# Patient Record
Sex: Male | Born: 1990 | Hispanic: No | Marital: Single
Health system: Midwestern US, Community
[De-identification: ages and names within clinical notes are randomized; demographics above are authoritative.]

## PROBLEM LIST (undated history)

## (undated) DIAGNOSIS — A150 Tuberculosis of lung: Secondary | ICD-10-CM

---
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
## 2012-02-13 ENCOUNTER — Encounter (HOSPITAL_COMMUNITY): Payer: Self-pay | Admitting: *Deleted

## 2012-02-13 ENCOUNTER — Emergency Department (HOSPITAL_COMMUNITY)
Admission: EM | Admit: 2012-02-13 | Discharge: 2012-02-13 | Disposition: A | Discharge status: Home / Self Care | Payer: Self-pay | Attending: Emergency Medicine | Admitting: Emergency Medicine

## 2012-02-13 ENCOUNTER — Emergency Department (HOSPITAL_COMMUNITY): Payer: Self-pay

## 2012-02-13 DIAGNOSIS — R059 Cough, unspecified: Secondary | ICD-10-CM | POA: Insufficient documentation

## 2012-02-13 DIAGNOSIS — R0789 Other chest pain: Secondary | ICD-10-CM

## 2012-02-13 DIAGNOSIS — R05 Cough: Secondary | ICD-10-CM | POA: Insufficient documentation

## 2012-02-13 DIAGNOSIS — R109 Unspecified abdominal pain: Secondary | ICD-10-CM | POA: Insufficient documentation

## 2012-02-13 DIAGNOSIS — T148XXA Other injury of unspecified body region, initial encounter: Secondary | ICD-10-CM | POA: Insufficient documentation

## 2012-02-13 DIAGNOSIS — Z201 Contact with and (suspected) exposure to tuberculosis: Secondary | ICD-10-CM | POA: Insufficient documentation

## 2012-02-13 DIAGNOSIS — F17200 Nicotine dependence, unspecified, uncomplicated: Secondary | ICD-10-CM | POA: Insufficient documentation

## 2012-02-13 DIAGNOSIS — Y9289 Other specified places as the place of occurrence of the external cause: Secondary | ICD-10-CM | POA: Insufficient documentation

## 2012-02-13 DIAGNOSIS — R071 Chest pain on breathing: Secondary | ICD-10-CM | POA: Insufficient documentation

## 2012-02-13 DIAGNOSIS — Y9389 Activity, other specified: Secondary | ICD-10-CM | POA: Insufficient documentation

## 2012-02-13 DIAGNOSIS — X503XXA Overexertion from repetitive movements, initial encounter: Secondary | ICD-10-CM | POA: Insufficient documentation

## 2012-02-13 HISTORY — DX: Tuberculosis of lung: A15.0

## 2012-02-13 MED ORDER — NAPROXEN 500 MG PO TABS: 500.0000 mg | ORAL_TABLET | Freq: Two times a day (BID) | ORAL | Status: AC

## 2012-02-13 NOTE — ED Notes (Signed)
[employer] notified for [place] interpreter. Denies having positive TB test on arm, states "health department dx TB from from X-Ray." he is here today for left flank pain, takes rifampin for TB treatment.  Tells interpreter-"pain on rib cage area for 15 days, pain is worse when coughing and sneezing, coughing up black stuff-- works in [employer].

## 2012-02-13 NOTE — ED Notes (Signed)
Has grandfather who died frpm tb, father is also being treated for tb., also being treated for TB

## 2012-02-13 NOTE — ED Provider Notes (Signed)
Medical screening examination/treatment/procedure(s) were performed by non-physician practitioner and as supervising physician I was immediately available for consultation/collaboration.  Jones Skene, M.D.     Jones Skene, MD 02/13/12 2345

## 2012-02-13 NOTE — ED Notes (Signed)
denes fevers,

## 2012-02-13 NOTE — ED Provider Notes (Signed)
History     CSN: [id number]  Arrival date & time 02/13/12  1444   First MD Initiated Contact with Patient 02/13/12 1556      Chief Complaint  Patient presents with  . Flank Pain    (Consider location/radiation/quality/duration/timing/severity/associated sxs/prior treatment) The history is provided by the patient and medical records. The history is limited by a language barrier. A language interpreter was used.    Derrick Mathis is a 21 y.o. male  with a hx of TB exposure on prophylactic Rifampin presents to the Emergency Department complaining of gradual, persistent, stabilized L rib pain onset 15 days ago. Associated symptoms include cough, pain with movement, walking, lifting and most intensely with coughing.  Nothing occluding ice makes it better and walking moving palpation makes it worse.  Pt denies fever, chills, headache, chest pain, shortness of breath, abdominal pain, nausea, vomiting, diarrhea, weakness, dizziness, syncope, night sweats, weight loss.  Patient taking rifampin secondary to exposure to his grandfather who died from active TB.  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  Patient also denies dysuria, hematuria, urgency, frequency, penile discharge or pain.  Patient denies trauma, falling or other known injury   Past Medical History  Diagnosis Date  . TB (pulmonary tuberculosis)     History reviewed. No pertinent past surgical history.  History reviewed. No pertinent family history.  History  Substance Use Topics  . Smoking status: Current Every Day Smoker    Types: Cigarettes  . Smokeless tobacco: Not on file  . Alcohol Use: No      Review of Systems  Constitutional: Negative for fever, diaphoresis, appetite change, fatigue and unexpected weight change.  HENT: Negative for mouth sores and neck stiffness.   Eyes: Negative for visual disturbance.  Respiratory: Positive for cough. Negative for chest  tightness, shortness of breath and wheezing.   Cardiovascular: Positive for chest pain.  Gastrointestinal: Negative for nausea, vomiting, abdominal pain, diarrhea and constipation.  Genitourinary: Negative for dysuria, urgency, frequency and hematuria.  Skin: Negative for rash.  Neurological: Negative for syncope, light-headedness and headaches.  Psychiatric/Behavioral: Negative for sleep disturbance. The patient is not nervous/anxious.   All other systems reviewed and are negative.    Allergies  Review of patient's allergies indicates no known allergies.  Home Medications   Current Outpatient Rx  Name  Route  Sig  Dispense  Refill  . NAPROXEN 500 MG PO TABS   Oral   Take 1 tablet (500 mg total) by mouth 2 (two) times daily with a meal.   30 tablet   0     BP 109/69  Pulse 97  Temp 98.6 F (37 C) (Oral)  Resp 20  SpO2 99%  Physical Exam  Nursing note and vitals reviewed. Constitutional: He is oriented to person, place, and time. He appears well-developed and well-nourished. No distress.  HENT:  Head: Normocephalic and atraumatic.  Right Ear: Tympanic membrane, external ear and ear canal normal.  Left Ear: Tympanic membrane, external ear and ear canal normal.  Nose: Nose normal. Right sinus exhibits no maxillary sinus tenderness and no frontal sinus tenderness. Left sinus exhibits no maxillary sinus tenderness and no frontal sinus tenderness.  Mouth/Throat: Uvula is midline, oropharynx is clear and moist and mucous membranes are normal. No oropharyngeal exudate.  Eyes: Conjunctivae normal and EOM are normal. Pupils are equal, round, and reactive to light. No scleral icterus.  Neck: Normal range of motion.  Neck supple.  Cardiovascular: Normal rate, regular rhythm, normal heart sounds and intact distal pulses.  Exam reveals no gallop and no friction rub.   No murmur heard. Pulmonary/Chest: Effort normal and breath sounds normal. No respiratory distress. He has no decreased  breath sounds. He has no wheezes. He has no rhonchi. He has no rales. He exhibits tenderness (tenderness over her floating ribs on the left side, no ecchymosis, erythema ).         Tender to palpation left lower ribs  Abdominal: Soft. Bowel sounds are normal. He exhibits no distension and no mass. There is no splenomegaly or hepatomegaly. There is no tenderness. There is no rigidity, no rebound, no guarding, no CVA tenderness, no tenderness at McBurney's point and negative Murphy's sign. No hernia.  Musculoskeletal: Normal range of motion. He exhibits no edema.  Lymphadenopathy:    He has no cervical adenopathy.  Neurological: He is alert and oriented to person, place, and time. No cranial nerve deficit. He exhibits normal muscle tone. Coordination normal.       Speech is clear and goal oriented Moves extremities without ataxia  Skin: Skin is warm and dry. No rash noted. He is not diaphoretic. No erythema.  Psychiatric: He has a normal mood and affect.    ED Course  Procedures (including critical care time)  Labs Reviewed - No data to display Dg Chest 2 View  02/13/2012  *RADIOLOGY REPORT*  Clinical Data: Pain and cough.  Possible tuberculosis history.  CHEST - 2 VIEW  Comparison: Chest radiograph 10/11/2011  Findings: Normal heart, mediastinal, and hilar contours.  The trachea is midline.  The lungs are well expanded and clear.  No scarring, airspace disease, or pleural effusion.  No acute bony abnormality.  IMPRESSION: No acute cardiopulmonary disease.  No radiographic evidence of pulmonary tuberculosis.   Original Report Authenticated By: Britta Mccreedy, M.D.      1. Chest wall pain   2. Muscle strain   3. Exposure to TB       MDM  [name] presents emergency department with chest wall pain. Patient with history of tuberculosis exposure on rifampin and. Chest x-ray without evidence of cardiac pulmonary disease or pulmonary tuberculosis. Patient states he's never had an x-ray that  was positive for tuberculosis that rifampin is simply prophylactic.  Likely muscle strain secondary to his coughing and heavy lifting at work.   Chest pain is not likely of cardiac or pulmonary etiology d/t presentation, perc negative, VSS, no tracheal deviation, no JVD or new murmur, RRR, breath sounds equal bilaterally and negative CXR. Pt has been advised to return to the ED if CP becomes exertional, associated with diaphoresis or nausea, radiates to left jaw/arm, worsens or becomes concerning in any way. Pt appears reliable for follow up and is agreeable to discharge.    1. Medications: Naprosyn, usual home medications 2. Treatment: rest, drink plenty of fluids, take medications as prescribed, use heat and gentle stretching for muscle soreness 3. Follow Up: Please followup with your primary doctor for discussion of your diagnoses and further evaluation after today's visit; if you do not have a primary care doctor use the resource guide provided to find one       Dierdre Forth, PA-C 02/13/12 1729

## 2012-02-13 NOTE — ED Notes (Signed)
Pt reports left side pain below his ribs x 15 days. Denies urinary symptoms. Pain increases with cough and breathing. Pt has been treated for TB x 3 month. Airway intact.

## 2012-10-02 ENCOUNTER — Emergency Department (INDEPENDENT_AMBULATORY_CARE_PROVIDER_SITE_OTHER): Payer: BC Managed Care – PPO

## 2012-10-02 ENCOUNTER — Emergency Department (HOSPITAL_COMMUNITY)
Admission: EM | Admit: 2012-10-02 | Discharge: 2012-10-02 | Disposition: A | Discharge status: Home / Self Care | Payer: BC Managed Care – PPO | Source: Home / Self Care | Attending: Family Medicine | Admitting: Family Medicine

## 2012-10-02 ENCOUNTER — Encounter (HOSPITAL_COMMUNITY): Payer: Self-pay | Admitting: *Deleted

## 2012-10-02 DIAGNOSIS — S60229A Contusion of unspecified hand, initial encounter: Secondary | ICD-10-CM

## 2012-10-02 DIAGNOSIS — S63601A Unspecified sprain of right thumb, initial encounter: Secondary | ICD-10-CM

## 2012-10-02 DIAGNOSIS — S60221A Contusion of right hand, initial encounter: Secondary | ICD-10-CM

## 2012-10-02 DIAGNOSIS — S6390XA Sprain of unspecified part of unspecified wrist and hand, initial encounter: Secondary | ICD-10-CM

## 2012-10-02 NOTE — ED Provider Notes (Signed)
History    CSN: [id number] Arrival date & time 10/02/12  1557  None    Chief Complaint  Patient presents with  . Hand Injury   (Consider location/radiation/quality/duration/timing/severity/associated sxs/prior Treatment) HPI Comments: 22 year old man was playing soccer yesterday the soccer ball struck him in the right hand. He describes ball impacting the end of his right thumb and the thenar eminence. He is complaining of pain primarily to the thenar eminence where there is edema and tenderness. No deformity. Denies injury elsewhere.  Past Medical History  Diagnosis Date  . TB (pulmonary tuberculosis)    History reviewed. No pertinent past surgical history. History reviewed. No pertinent family history. History  Substance Use Topics  . Smoking status: Current Every Day Smoker    Types: Cigarettes  . Smokeless tobacco: Not on file  . Alcohol Use: No    Review of Systems  Constitutional: Negative.   Respiratory: Negative.   Cardiovascular: Negative.   Genitourinary: Negative.   Musculoskeletal: Negative for back pain.       Right hand pain as in history of present illness  Skin: Negative.     Allergies  Review of patient's allergies indicates no known allergies.  Home Medications   Current Outpatient Rx  Name  Route  Sig  Dispense  Refill  . naproxen (NAPROSYN) 500 MG tablet   Oral   Take 1 tablet (500 mg total) by mouth 2 (two) times daily with a meal.   30 tablet   0    BP 109/61  Pulse 59  Temp(Src) 98.2 F (36.8 C) (Oral)  Resp 16  SpO2 100% Physical Exam  Nursing note and vitals reviewed. Constitutional: He is oriented to person, place, and time. He appears well-developed and well-nourished.  HENT:  Head: Normocephalic and atraumatic.  Eyes: EOM are normal. Left eye exhibits no discharge.  Neck: Normal range of motion. Neck supple.  Pulmonary/Chest: Effort normal.  Musculoskeletal: He exhibits edema and tenderness.  There is swelling over the  thenar eminence and lesser to the right thumb. Tenderness to the IP joint and to most of the soft tissue of the thenar eminence. Patient is able to oppose the as well as flex and extend the digits. There is no wrist pain, tenderness or decrease in range of motion. Distal neurovascular motor sensory is intact.  Neurological: He is alert and oriented to person, place, and time. No cranial nerve deficit. He exhibits normal muscle tone.  Skin: Skin is warm and dry.  Psychiatric: He has a normal mood and affect.    ED Course  Procedures (including critical care time) Labs Reviewed - No data to display Dg Hand Complete Right  10/02/2012   *RADIOLOGY REPORT*  Clinical Data: Patient injured hand while playing soccer yesterday. Pain in thumb and first metacarpal region.  RIGHT HAND - COMPLETE 3+ VIEW  Comparison: None.  Findings: Normal bony mineralization and alignment.  No acute or healing fracture or focal bony abnormality is identified.  3 mm bony density adjacent to the ulna styloid may be congenital nonunion of the ulna styloid tip or remote injury.  No radiopaque foreign body is identified.  No discrete soft tissue swelling is appreciated radiographically.  IMPRESSION: No acute bony abnormality identified.   Original Report Authenticated By: Britta Mccreedy, M.D.   1. Hand contusion, right, initial encounter   2. Thumb sprain, right, initial encounter     MDM  Ace wrap to the hand and wrist wear for the next 3 days. Limit  use of the hand so as not to reinjure it. May continue to apply ice off and on for the next day or 2. Per any new symptoms problems or worsening may return.  Hayden Rasmussen, NP 10/02/12 [phone]

## 2012-10-02 NOTE — Discharge Instructions (Signed)
Hand Contusion A hand contusion is a deep bruise on your hand area. Contusions are the result of an injury that caused bleeding under the skin. The contusion may turn blue, purple, or yellow. Minor injuries will give you a painless contusion, but more severe contusions may stay painful and swollen for a few weeks. CAUSES  A contusion is usually caused by a blow, trauma, or direct force to an area of the body. SYMPTOMS   Swelling and redness of the injured area.  Discoloration of the injured area.  Tenderness and soreness of the injured area.  Pain. DIAGNOSIS  The diagnosis can be made by taking a history and performing a physical exam. An X-ray, CT scan, or MRI may be needed to determine if there were any associated injuries, such as broken bones (fractures). TREATMENT  Often, the best treatment for a hand contusion is resting, elevating, icing, and applying cold compresses to the injured area. Over-the-counter medicines may also be recommended for pain control. HOME CARE INSTRUCTIONS   Put ice on the injured area.  Put ice in a plastic bag.  Place a towel between your skin and the bag.  Leave the ice on for 15-20 minutes, 3-4 times a day.  Only take over-the-counter or prescription medicines as directed by your caregiver. Your caregiver may recommend avoiding anti-inflammatory medicines (aspirin, ibuprofen, and naproxen) for 48 hours because these medicines may increase bruising.  If told, use an elastic wrap as directed. This can help reduce swelling. You may remove the wrap for sleeping, showering, and bathing. If your fingers become numb, cold, or blue, take the wrap off and reapply it more loosely.  Elevate your hand with pillows to reduce swelling.  Avoid overusing your hand if it is painful. SEEK IMMEDIATE MEDICAL CARE IF:   You have increased redness, swelling, or pain in your hand.  Your swelling or pain is not relieved with medicines.  You have loss of feeling in  your hand or are unable to move your fingers.  Your hand turns cold or blue.  You have pain when you move your fingers.  Your hand becomes warm to the touch.  Your contusion does not improve in 2 days. MAKE SURE YOU:   Understand these instructions.  Will watch your condition.  Will get help right away if you are not doing well or get worse. Document Released: 08/21/2001 Document Revised: 11/24/2011 Document Reviewed: 08/23/2011 [facility] Patient Information 2014 [place], [place].  Finger Sprain A finger sprain happens when the bands of tissue that hold the finger bones together (ligaments) stretch too much and tear. HOME CARE  Keep your injured finger raised (elevated) when possible.  Put ice on the injured area, twice a day, for 2 to 3 days.  Put ice in a plastic bag.  Place a towel between your skin and the bag.  Leave the ice on for 15 minutes.  Only take medicine as told by your doctor.  Do not wear rings on the injured finger.  Protect your finger until pain and stiffness go away (usually 3 to 4 weeks).  Do not get your cast or splint to get wet. Cover your cast or splint with a plastic bag when you shower or bathe. Do not swim.  Your doctor may suggest special exercises for you to do. These exercises will help keep or stop stiffness from happening. GET HELP RIGHT AWAY IF:  Your cast or splint gets damaged.  Your pain gets worse, not better. MAKE SURE YOU:  Understand these instructions.  Will watch your condition.  Will get help right away if you are not doing well or get worse. Document Released: 04/03/2010 Document Revised: 05/24/2011 Document Reviewed: 11/02/2010 [facility] Patient Information 2014 [place], [place].

## 2012-10-02 NOTE — ED Provider Notes (Signed)
Medical screening examination/treatment/procedure(s) were performed by resident physician or non-physician practitioner and as supervising physician I was immediately available for consultation/collaboration.   Barkley Bruns MD.   Linna Hoff, MD 10/02/12 2026

## 2012-10-02 NOTE — ED Notes (Signed)
Pt  Reports   He  Injured     His  r  Runner, broadcasting/film/video  Soccer       -      Pt        Has  Decreased   rom     And  Pain  When    He  Moves     His  Hand                    No  Obvious  Deformity  Noted

## 2014-09-11 IMAGING — CR DG HAND COMPLETE 3+V*R*
3 series · 3 of 3 positions shown · non-contrast
Comparison: None.

CLINICAL DATA: Patient injured hand while playing soccer yesterday.
Pain in thumb and first metacarpal region.

RIGHT HAND - COMPLETE 3+ VIEW

[view not recorded (1 of 3)]
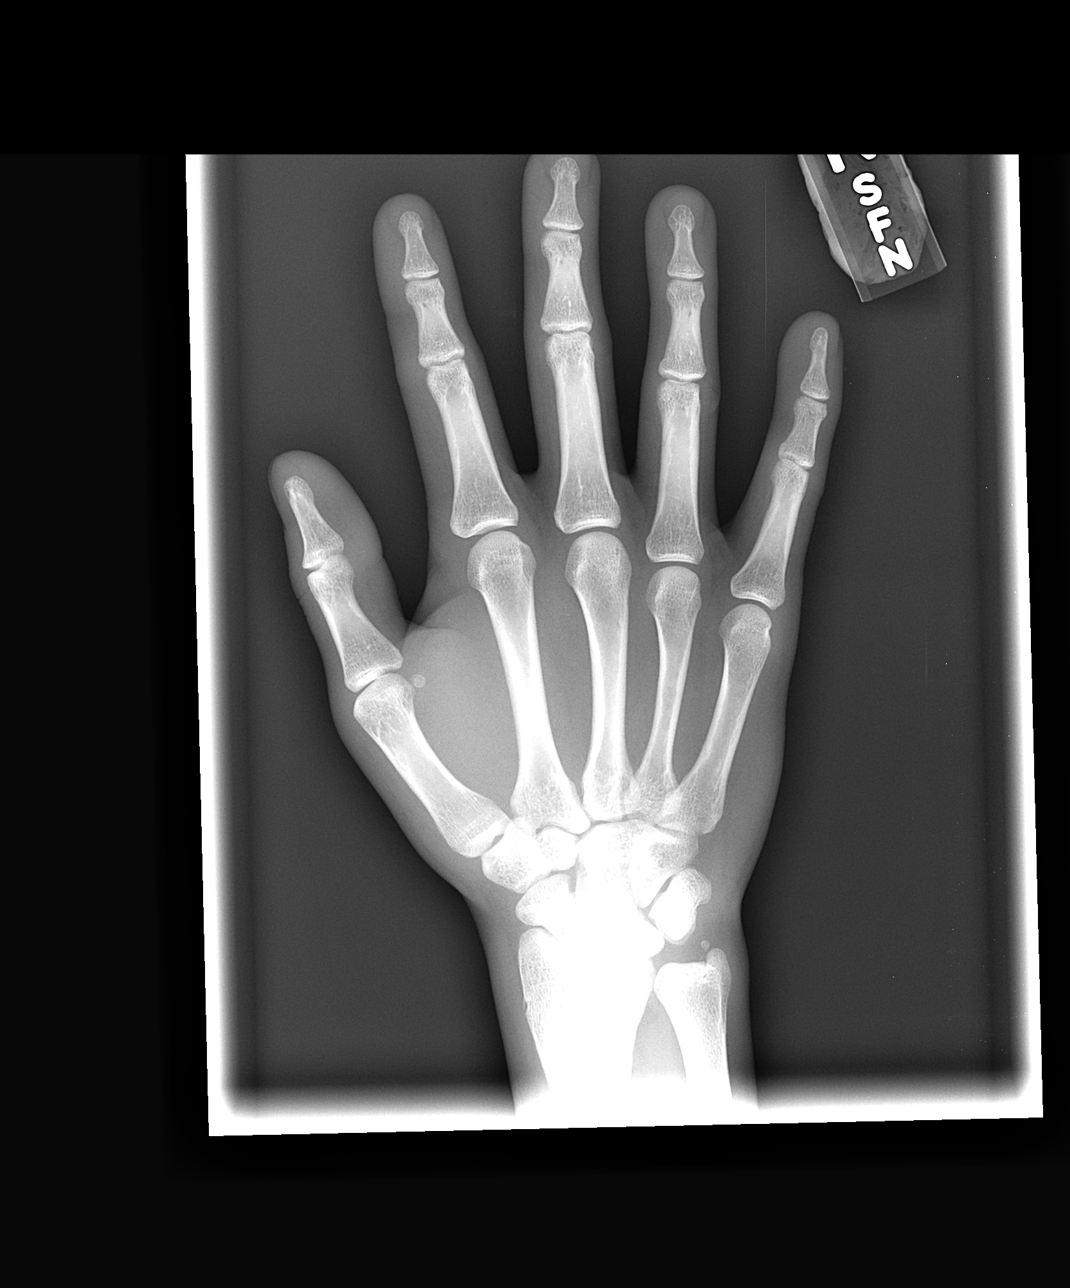

[view not recorded (2 of 3)]
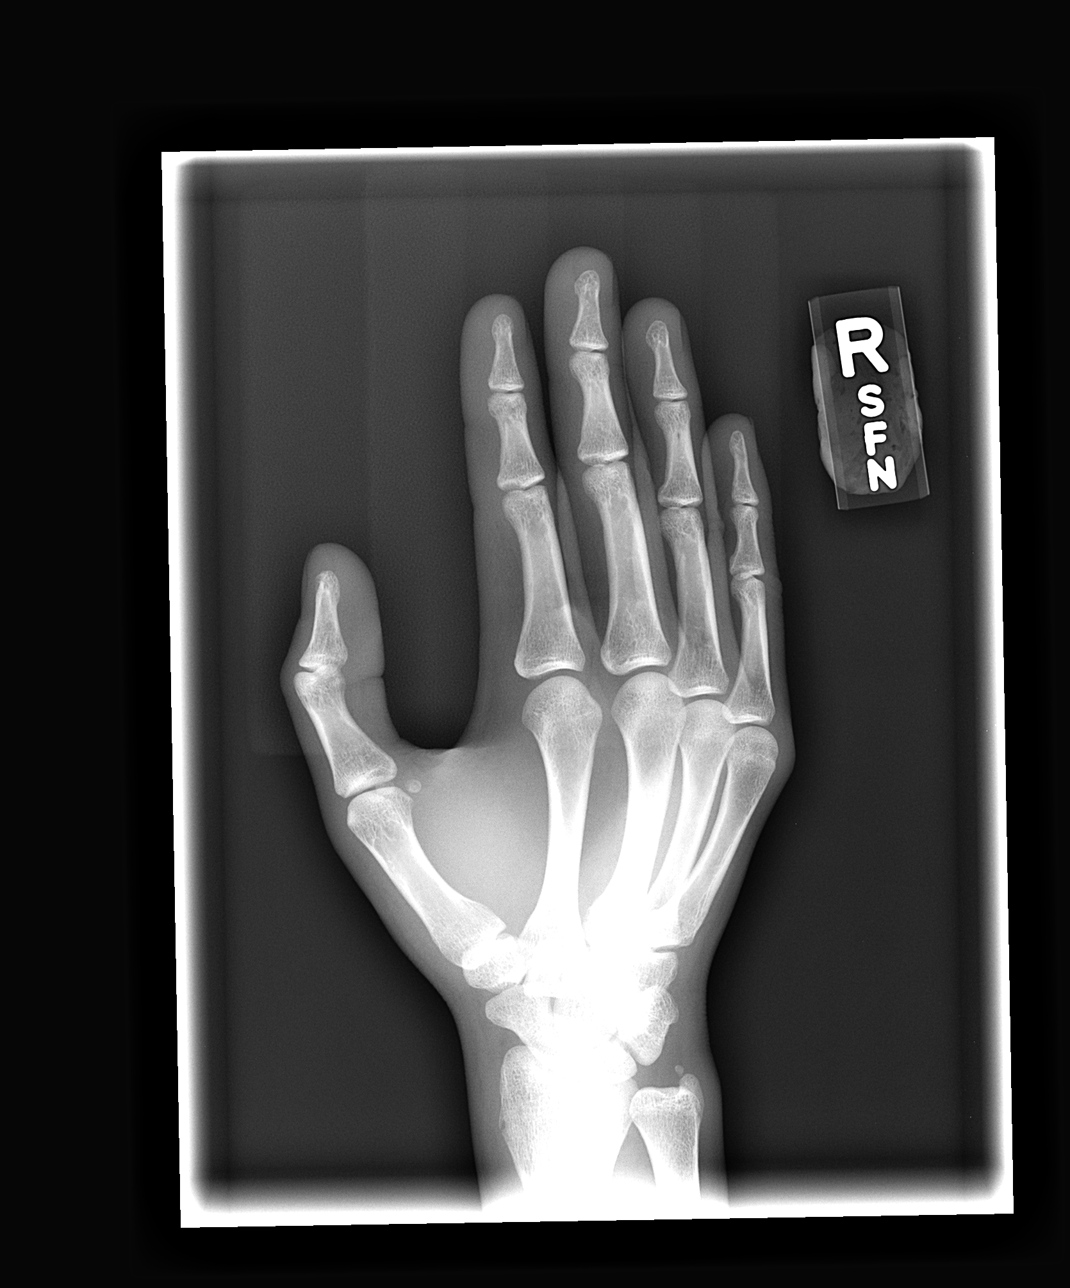

[view not recorded (3 of 3)]
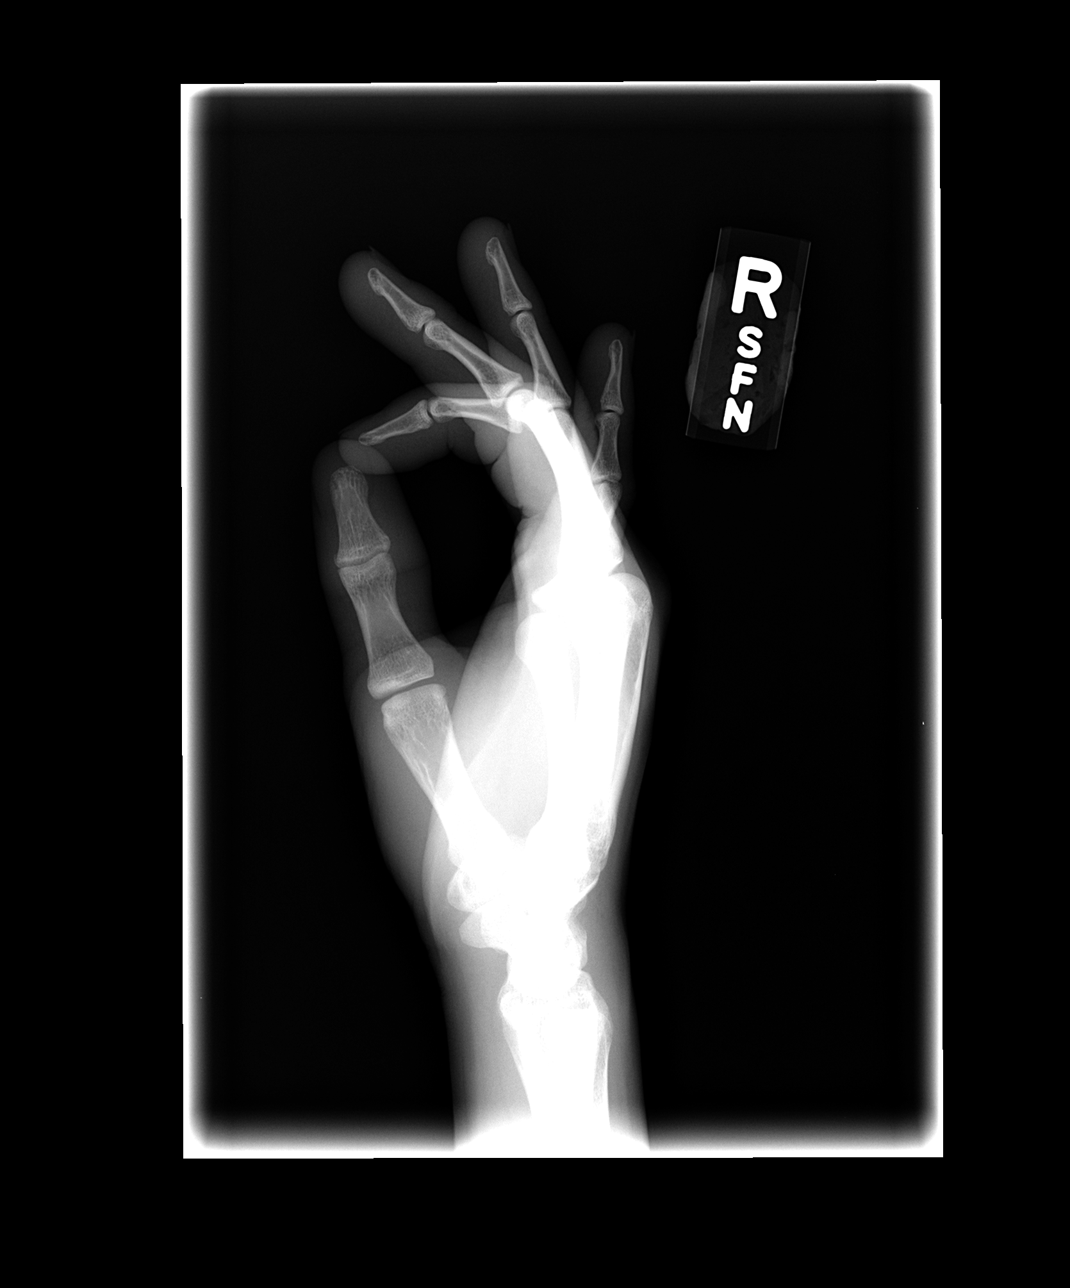

[3 of 3 positions shown; findings below may reference images not displayed]

FINDINGS: Normal bony mineralization and alignment.  No acute or
healing fracture or focal bony abnormality is identified.

3 mm bony density adjacent to the ulna styloid may be congenital
nonunion of the ulna styloid tip or remote injury.

No radiopaque foreign body is identified.  No discrete soft tissue
swelling is appreciated radiographically.
IMPRESSION: No acute bony abnormality identified.

## 2015-12-06 ENCOUNTER — Encounter (HOSPITAL_COMMUNITY): Payer: Self-pay | Admitting: *Deleted

## 2015-12-06 ENCOUNTER — Emergency Department (HOSPITAL_COMMUNITY)
Admission: EM | Admit: 2015-12-06 | Discharge: 2015-12-07 | Disposition: A | Discharge status: Home / Self Care | Payer: Self-pay | Attending: Emergency Medicine | Admitting: Emergency Medicine

## 2015-12-06 DIAGNOSIS — F17210 Nicotine dependence, cigarettes, uncomplicated: Secondary | ICD-10-CM | POA: Insufficient documentation

## 2015-12-06 DIAGNOSIS — R369 Urethral discharge, unspecified: Secondary | ICD-10-CM | POA: Insufficient documentation

## 2015-12-06 MED ORDER — CEFTRIAXONE SODIUM 250 MG IJ SOLR
250.0000 mg | Freq: Once | INTRAMUSCULAR | Status: AC
Start: 2015-12-06 — End: 2015-12-06
  Administered 2015-12-06: 250 mg via INTRAMUSCULAR
  Filled 2015-12-06: qty 250

## 2015-12-06 MED ORDER — AZITHROMYCIN 250 MG PO TABS
1000.0000 mg | ORAL_TABLET | Freq: Once | ORAL | Status: AC
  Administered 2015-12-06: 1000 mg via ORAL
  Filled 2015-12-06: qty 4

## 2015-12-06 MED ORDER — LIDOCAINE HCL (PF) 1 % IJ SOLN
INTRAMUSCULAR | Status: AC
Start: 2015-12-06 — End: 2015-12-06
  Administered 2015-12-06: 5 mL
  Filled 2015-12-06: qty 5

## 2015-12-06 NOTE — ED Triage Notes (Signed)
The pt  Reports that he has something coming out of his penis for one year  He has discomfort sometimes

## 2015-12-06 NOTE — ED Provider Notes (Signed)
[facility]-EMERGENCY DEPT Provider Note   CSN: [id number] Arrival date & time: 12/06/15  2237  By signing my name below, I, [name], attest that this documentation has been prepared under the direction and in the presence of  Roxy Horseman, PA-C. Electronically Signed: [name], ED Scribe. 12/06/15. 11:31 PM.  History   Chief Complaint Chief Complaint  Patient presents with  . Penile Discharge   The history is provided by the patient and medical records. No language interpreter was used.     HPI Comments:  Derrick Mathis is a 25 y.o. male who presents to the Emergency Department complaining of penile discharge for the last year.  He reports there was no change in his symptoms that brought him in today.  He states there used to be erythematous vesicles on his penis that resemble acne which are worse after sex.  He also notes slight pain after sex.  He has been using castor oil without relief.  He denies testicular pain, fever, or any other symptoms.    Past Medical History:  Diagnosis Date  . TB (pulmonary tuberculosis)   . TB (pulmonary tuberculosis)     There are no active problems to display for this patient.   History reviewed. No pertinent surgical history.     Home Medications    Prior to Admission medications   Medication Sig Start Date End Date Taking? Authorizing Provider  naproxen (NAPROSYN) 500 MG tablet Take 1 tablet (500 mg total) by mouth 2 (two) times daily with a meal. 02/13/12   Dahlia Client Muthersbaugh, PA-C    Family History No family history on file.  Social History Social History  Substance Use Topics  . Smoking status: Current Every Day Smoker    Types: Cigarettes  . Smokeless tobacco: Never Used  . Alcohol use Yes     Allergies   Review of patient's allergies indicates no known allergies.   Review of Systems Review of Systems  Constitutional: Negative for fever.  Genitourinary: Positive for discharge. Negative for difficulty  urinating, dysuria, scrotal swelling and testicular pain.  Skin: Negative for rash and wound.     Physical Exam Updated Vital Signs BP 134/78 (BP Location: Right Arm)   Pulse 86   Temp 98.2 F (36.8 C) (Oral)   Resp 16   Ht 5\' 9"  (1.753 m)   Wt 130 lb 9 oz (59.2 kg)   SpO2 100%   BMI 19.28 kg/m   Physical Exam  Constitutional: He is oriented to person, place, and time. He appears well-developed and well-nourished. No distress.  HENT:  Head: Normocephalic and atraumatic.  Eyes: Conjunctivae are normal.  Cardiovascular: Normal rate.   Pulmonary/Chest: Effort normal.  Genitourinary:  Genitourinary Comments: No discharge Uncircumcised No rash No lesions or masses   Neurological: He is alert and oriented to person, place, and time.  Skin: Skin is warm and dry.  Psychiatric: He has a normal mood and affect.  Nursing note and vitals reviewed.    ED Treatments / Results   DIAGNOSTIC STUDIES:  Oxygen Saturation is 100% on RA, NML by my interpretation.    COORDINATION OF CARE:  11:31 PM Discussed treatment plan with pt at bedside and pt agreed to plan.  Labs (all labs ordered are listed, but only abnormal results are displayed) Labs Reviewed  GC/CHLAMYDIA PROBE AMP ([place]) NOT AT [facility]    EKG  EKG Interpretation None       Radiology No results found.  Procedures Procedures (including critical care  time)  Medications Ordered in ED Medications  cefTRIAXone (ROCEPHIN) injection 250 mg (not administered)  azithromycin (ZITHROMAX) tablet 1,000 mg (not administered)     Initial Impression / Assessment and Plan / ED Course  I have reviewed the triage vital signs and the nursing notes.  Pertinent labs & imaging results that were available during my care of the patient were reviewed by me and considered in my medical decision making (see chart for details).  Clinical Course    Patient with penile discharge x 1 year.  Will treat with azithro and  rocephin.  Recommend health department follow-up.  Final Clinical Impressions(s) / ED Diagnoses   Final diagnoses:  Penile discharge    New Prescriptions New Prescriptions   No medications on file   I personally performed the services described in this documentation, which was scribed in my presence. The recorded information has been reviewed and is accurate.      Roxy Horsemanobert Cristen Murcia, PA-C 12/06/15 [id number]    Lavera Guiseana Duo Liu, MD 12/07/15 1056

## 2015-12-07 NOTE — ED Notes (Signed)
Patient verbalized understanding of discharge instructions and denies any further needs or questions at this time. VS stable. Patient ambulatory with steady gait, declined wheelchair. RN escorted pt to ED entrance.  

## 2015-12-08 LAB — GC/CHLAMYDIA PROBE AMP (~~LOC~~) NOT AT ARMC
CHLAMYDIA, DNA PROBE: NEGATIVE
Neisseria Gonorrhea: NEGATIVE

## 2016-10-10 ENCOUNTER — Emergency Department (HOSPITAL_BASED_OUTPATIENT_CLINIC_OR_DEPARTMENT_OTHER)
Admission: EM | Admit: 2016-10-10 | Discharge: 2016-10-11 | Disposition: A | Discharge status: Home / Self Care | Payer: Self-pay

## 2016-10-10 ENCOUNTER — Encounter (HOSPITAL_BASED_OUTPATIENT_CLINIC_OR_DEPARTMENT_OTHER): Payer: Self-pay | Admitting: *Deleted

## 2019-01-19 ENCOUNTER — Emergency Department: Admit: 2019-01-20

## 2019-01-19 ENCOUNTER — Inpatient Hospital Stay: Admit: 2019-01-19 | Discharge: 2019-01-20 | Disposition: A | Discharge status: Home / Self Care

## 2019-01-19 DIAGNOSIS — Z20828 Contact with and (suspected) exposure to other viral communicable diseases: Secondary | ICD-10-CM

## 2019-01-19 NOTE — Discharge Instructions (Signed)
Return for any worsening symptoms to include abdominal pain, nausea, vomiting, diarrhea, chest pain, shortness of air, cough, headache, back pain, fever over 100.5, lightheaded, dizzy, weakness, numbness, tingling, confusion, syncope, worsening symptoms or further concerns.    Most people with respiratory infections like colds, the flu, and Coronavirus Disease (COVID-19) will have mild illness and can get better with appropriate home care and without the need to see a medical provider. People who are elderly, pregnant, or have a weak immune system, or other medical problem are at higher risk of more serious illness or complications. It is recommended that they carefully monitor their symptoms closely and seek medical care early.     Treatment   There is no specific treatment for most viruses, including those that cause the common cold and those that cause COVID-19. Sometimes there is treatment for viruses that cause influenza if given soon after the onset of symptoms. Antibiotics treat infections caused by bacteria, but they do not work against viruses.  Most people recover on their own from these viruses, including COVID-19. Here are steps that you can take to help you get better:      Rest     Drink plenty of fluids     Take over-the-counter cold and flu medications to reduce fever and pain. Follow the instructions on the package, unless your doctor gave you specific instructions. Note that these medicines do not 'cure' the illness and do not stop you from spreading germs.    When to Seek Medical Care   You should seek medical care if you are not getting better within a week, or if your symptoms get worse. It is best to call your doctor ahead of time to discuss your symptoms, if possible.  Some providers offer telemedicine services that can assist as well.  This may allow you to receive the advice you need by phone. By avoiding a visit to a healthcare facility, you protect yourself from getting a new infection and  protect others from catching an infection from you. If you do visit a healthcare facility, put on a mask to protect other patients and staff.     It is recommended that you seek medical care and return immediately to the Emergency Department for any serious symptoms, such as:     Return immediately for any difficulty breathing, confusion, dizziness or passing out, vomiting, chest pain, high fevers, weakness, or any other new or concerning symptoms.  ** People with potentially life-threatening symptoms should call 911. If possible, put on a facemask before emergency medical services arrive.         What should I do if home isolation has been recommended?  The following instructions are provided to assist you to safely care for yourself or others who are infected or potentially infected with COVID-19. These instructions are also available at:   https://www.cdc.gov/coronavirus/2019-ncov/hcp/guidance-prevent-spread.html    It has been determined that you do not need to be hospitalized at this time, and can safely be isolated at home.  You should follow the prevention steps below until a health care provider or local health department says you can return to your normal activities.    Stay home except to get medical care  You should restrict activities outside your home, except for getting medical care. Under no circumstance should you go to work, school, or public areas. Avoid using public transportation, ride sharing, or taxis.    Separate yourself from other people and animals in your home  People: As   much as possible, you should stay in a specific room and away from other people in your home. Also, you should use a separate bathroom, if available.    Animals: You should restrict contact with pets and other animals while you are sick with COVID-19, just like you would around other people. Although there have not been reports of pets or other animals becoming sick with COVID-19, it is still recommended that people with  COVID-19 limit contact with animals until more information is known about the virus. When possible, have another member of your household care for your animals while you are sick. If you must care for your pet or be around animals while you are sick, wash your hands before and after you interact with pets and wear a facemask.     Call ahead before visiting your doctor  If you have a medical appointment, call the health care provider prior to your appointment and tell them that you have or may have COVID-19. This will help the health care provider's office take steps to keep other people from getting infected or exposed. Ask your health care provider to call the local or state health department. Persons who are placed under active monitoring or self-monitoring should follow instructions provided by their local health department or occupational health professionals, as appropriate. If you have a medical emergency and need to call 911, notify the dispatch personnel that you have, or are being evaluated for COVID-19. If possible, put on a facemask before emergency medical services arrive.    Take care of your mental health  You might be feeling anxious, afraid, lonely or uncertain.  The following link has a guide with a list of helpful behavioral health resources and a few tips for taking care of your emotional health while you are quarantined:   https://store.samhsa.gov/system/files/sma14-4894.pdf     Wear a face mask  You should wear a facemask when you are around other people (for example, sharing a room or vehicle) or pets, and before you enter a health care provider's office. If you are not able to wear a facemask (for example, because it causes trouble breathing), then people who live with you should not stay in the same room with you, or they should wear a facemask if they enter your room.    Cover your coughs and sneezes  Cover your mouth and nose with a tissue when you cough or sneeze. Throw used tissues in a  lined trash can.    Clean your hands often  Wash your hands often with soap and water for at least 20 seconds or clean your hands with an alcohol-based hand sanitizer that contains 60 to 95% alcohol, covering all surfaces of your hands and rubbing them together until they feel dry. Soap and water should be used preferentially if your hands are visibly dirty. Avoid touching your eyes, nose, and mouth with unwashed hands.    Avoid touching your face  Viruses that affect the respiratory system enter the body through mucous membranes which are found in the eyes, nose, and mouth.  It only takes one touch for germs on your fingers to enter your body through your nostrils, eyes, or mouth.  In addition to hand hygiene, this is an important way to help prevent transmission of infections.    Clean all high-touch surfaces every day  High-touch surfaces include counters, tabletops, doorknobs, bathroom fixtures, toilets, phones, keyboards, tablets, and bedside tables. Also, clean any surfaces that may have blood, stool, or body   fluids on them. Use a household cleaning spray or wipe, according to the label instructions. Labels contain instructions for safe and effective use of the cleaning product including precautions you should take when applying the product, such as wearing gloves and making sure you have good ventilation while using the product.    Avoid sharing personal household items  You should not share dishes, drinking glasses, cups, eating utensils, towels or bedding with other people or pets in your home. After using these items, they should be washed thoroughly with soap and water.    Monitor your symptoms  Please contact the [place] Department of Health as soon as possible. Persons who are placed under active monitoring or facilitated self-monitoring should follow instructions provided by their local health department or occupational health professionals, as appropriate.    Additional information is available at:      www.coronavirus.gov    Seek immediate medical attention if your illness is worsening of if you develop any of the following: difficulty breathing, confusion, dizziness or passing out, vomiting, high fevers, weakness, or any other new or concerning symptoms. Before seeking care, call your health care provider and tell them that you have, or are being evaluated for COVID-19. Put on a facemask before you enter the facility. These steps will help keep other people in the office or waiting room from getting infected or exposed.     If you have a medical emergency and need to call 911, notify the dispatch personnel that you have, or are being evaluated for COVID-19. If possible, put on a face mask before emergency medical services arrive.    Discontinuing home isolation  Patients with confirmed COVID-19 should remain under home isolation precautions until the risk of secondary transmission to others is thought to be low. The decision to discontinue home isolation precautions is made on a case-by-case basis, in consultation with health care providers, and state and local health departments.      Recommended precautions for household members, intimate partners, and caregivers  If you are providing care for a person infected or suspected to be infected with COVID-19, please note the following instructions, which are also available at: https://www.cdc.gov/coronavirus/2019-ncov/hcp/guidance-prevent-spread.html     How do I take care of someone who is quarantined in my home?  Household members, intimate partners, and caregivers in a non-health care setting may have close contact (within 6 feet) with a person with symptomatic, laboratory-confirmed COVID-19 or a person under investigation. Those in close contact should monitor their health and should call their health care provider right away if they develop symptoms suggestive of COVID-19 (such as fever, cough, shortness of breath).   Those in close contact should also  follow these recommendations:  Make sure that you understand and can help the patient follow their health care provider's instructions for medication(s) and care. You should help the patient with basic needs in the home and provide support for getting groceries, prescriptions and other personal needs  Monitor the patient's symptoms. If the patient is getting sicker, call his or her health care provider and tell them that the patient has laboratory-confirmed or is under investigation for COVID-19. This will help the health care provider's office take steps to keep other people in the office or waiting room from getting infected. Ask the health care provider to call the local or state health department for additional guidance. If the patient has a medical emergency and you need to call 911, notify the dispatch personnel that the patient has, or   is being evaluated for COVID-19  Household members should stay in another room or be separated from the patient as much as possible. Household members should use a separate bedroom and bathroom, if available  Prohibit visitors who do not have an essential need to be in the home  Household members should care for any pets in the home. Do not handle pets or other animals while sick    Make sure that shared spaces in the home have good airflow, such as by an air conditioner or an opened window, weather permitting  Perform hand hygiene frequently. Wash your hands often with soap and water for at least 20 seconds or use an alcohol-based hand sanitizer that contains 60 to 95% alcohol, covering all surfaces of your hands and rubbing them together until they feel dry. Soap and water should be used preferentially if hands are visibly dirty  Avoid touching your eyes, nose, and mouth with unwashed hands  You and the patient should wear a facemask if you are in the same room  Wear a disposable facemask and gloves when you touch or have contact with the patient's blood, stool, or body  fluids, such as saliva, sputum, nasal mucus, vomit or urine.  Do not reuse disposable facemasks and gloves. Throw them away after using them.   When removing personal protective equipment, first remove and dispose of gloves. Then, immediately clean your hands with soap and water or alcohol-based hand sanitizer. Next, remove and dispose of facemask, and immediately clean your hands again with soap and water or alcohol-based hand sanitizer  Avoid sharing household items with the patient. You should not share dishes, drinking glasses, cups, eating utensils, towels, bedding or other items. After the patient uses these items, you should wash them thoroughly (see below)  Clean all high-touch surfaces, such as counters, tabletops, doorknobs, bathroom fixtures, toilets, phones, keyboards, tablets and bedside tables, every day. Also, clean any surfaces that may have blood, stool, or body fluids on them  Use a household cleaning spray or wipe, according to the label instructions. Labels contain instructions for safe and effective use of the cleaning product including precautions you should take when applying the product, such as wearing gloves and making sure you have good ventilation during the use of the product  Wash laundry thoroughly  Immediately remove and wash clothes or bedding that have blood, stool, or body fluids on them  Wear disposable gloves while handling soiled items and keep soiled items away from your body. Clean your hands (with soap and water or an alcohol-based hand sanitizer) immediately after removing and throwing away your gloves  Read and follow directions on labels for laundry or clothing items and detergent. In general, using a normal laundry detergent according to washing machine instructions and dry thoroughly using the warmest temperatures recommended on the clothing label  Place all used disposable gloves, facemasks, and other contaminated items in a lined container before disposing of them with  other household waste. Clean your hands (with soap and water or an alcohol-based hand sanitizer) immediately after handling these items. Soap and water should be used preferentially if hands are visibly dirty  Discuss any additional questions with your state or local health department or health care provider    What if I live with someone who's been told to self-quarantine?  If the person you live with is NOT exhibiting respiratory symptoms, you can go about your day-to-day business, and you do not need to be tested or monitored.     If the person you live with has respiratory symptoms (such as coughing or sneezing), but has not yet tested positive for COVID-19:  Please make sure to stay home  Monitor your symptoms closely, and seek medical attention if your symptoms are worsening   Avoid public areas and public transportation  Wear a facemask if you are sick  Cover your coughs and sneezes with a tissue, dispose of the tissue and immediately wash your hands  Wash your hands often for at least 20 seconds, and if soap and water are not available, use hand sanitizer   Avoid touching your eyes, nose or mouth  Avoid sharing personal household items  Clean 'high-touch' surfaces daily  If the person you live with has tested positive for COVID-19, you will be considered a close contact, and will also likely be asked to self-quarantine.

## 2019-01-19 NOTE — ED Provider Notes (Signed)
[facility] EMERGENCY DEPARTMENT  EMERGENCY DEPARTMENT ENCOUNTER        Pt Name: Benjamin Reese  MRN: [id number]  Birthdate 05-24-1990  Date of evaluation: 01/19/2019  Provider: Isa Rankin, APRN - CNP  PCP: No primary care provider on file.    APP. I have evaluated this patient.  My supervising physician was available for consultation.    CHIEF COMPLAINT       Chief Complaint   Patient presents with   ??? Concern For COVID-19     Pt to er with c/o exposure to covid by family, was not aware they had been tested. loss of appetite and feels odd. denies SOB or CP       HISTORY OF PRESENT ILLNESS   (Location, Timing/Onset, Context/Setting, Quality, Duration, Modifying Factors, Severity, Associated Signs and Symptoms)  Note limiting factors.     Benjamin Reese is a 28 y.o. male who presents the ED with complaints of loss of appetite, decreased sense of taste and smell, rhinorrhea, and sensation of short of air.  Patient said on 01/15/2019 he went to visit his aunt.  He was unaware that his aunt had a COVID-19 test pending at that time.  She then informed him on 01/16/2019 that was positive along with the for additional household members that he had contact with.  Yesterday he had started noticing a decrease in his sense of taste and smell and thought he was having intermittent shortness of air.  This had concerned him and he then started to wear his mask and self isolate from his parents and brother who he currently resides with.  Patient comes to the ED requesting a Covid test as he did have a known exposure and is now symptomatic.  He denies any associated fevers, wheezing, chest pain, vomiting, diarrhea, lightheaded, dizzy, or syncope.  He denies taking any medication at home for the symptoms.  He denies previously being tested for COVID-19.    Nursing Notes were all reviewed and agreed with or any disagreements were addressed in the HPI.    REVIEW OF SYSTEMS    (2-9 systems for level 4, 10 or more  for level 5)     Review of Systems    Positives and Pertinent negatives as per HPI.  Except as noted above in the ROS, all other systems were reviewed and negative.       PAST MEDICAL HISTORY   History reviewed. No pertinent past medical history.      SURGICAL HISTORY   History reviewed. No pertinent surgical history.      CURRENTMEDICATIONS       Discharge Medication List as of 01/19/2019  9:25 PM            ALLERGIES     Patient has no known allergies.    FAMILYHISTORY     History reviewed. No pertinent family history.       SOCIAL HISTORY       Social History     Tobacco Use   ??? Smoking status: Never Smoker   ??? Smokeless tobacco: Never Used   Substance Use Topics   ??? Alcohol use: Not on file   ??? Drug use: Never       SCREENINGS             PHYSICAL EXAM    (up to 7 for level 4, 8 or more for level 5)     ED Triage Vitals [01/19/19 1642]   BP  Temp Temp Source Pulse Resp SpO2 Height Weight   122/83 98.8 ??F (37.1 ??C) Oral 104 18 97 % 5\' 7"  (1.702 m) 175 lb (79.4 kg)       Physical Exam  Vitals signs and nursing note reviewed.   Constitutional:       General: He is awake.      Appearance: Normal appearance. He is well-developed and normal weight.   HENT:      Head: Normocephalic and atraumatic.      Nose: Nose normal.   Eyes:      General:         Right eye: No discharge.         Left eye: No discharge.   Neck:      Musculoskeletal: Normal range of motion.   Cardiovascular:      Rate and Rhythm: Normal rate and regular rhythm.      Heart sounds: Normal heart sounds.   Pulmonary:      Effort: Pulmonary effort is normal. No respiratory distress.      Breath sounds: Normal breath sounds.   Abdominal:      General: Bowel sounds are normal.      Palpations: Abdomen is soft.      Tenderness: There is no abdominal tenderness.   Musculoskeletal: Normal range of motion.   Skin:     General: Skin is warm and dry.      Coloration: Skin is not pale.   Neurological:      Mental Status: He is alert and oriented to person, place,  and time.   Psychiatric:         Behavior: Behavior normal. Behavior is cooperative.         DIAGNOSTIC RESULTS   LABS:    Labs Reviewed   COVID-19       All other labs were within normal range or not returned as of this dictation.    EKG: All EKG's are interpreted by the Emergency Department Physician in the absence of a cardiologist.  Please see their note for interpretation of EKG.      RADIOLOGY:   Non-plain film images such as CT, Ultrasound and MRI are read by the radiologist. Plain radiographic images are visualized and preliminarily interpreted by the ED Provider with the below findings:        Interpretation per the Radiologist below, if available at the time of this note:    XR CHEST PORTABLE   Final Result   Negative chest radiograph.           No results found.        PROCEDURES   Unless otherwise noted below, none     Procedures    CRITICAL CARE TIME   N/A    CONSULTS:  None      EMERGENCY DEPARTMENT COURSE and DIFFERENTIAL DIAGNOSIS/MDM:   Vitals:    Vitals:    01/19/19 1642   BP: 122/83   Pulse: 104   Resp: 18   Temp: 98.8 ??F (37.1 ??C)   TempSrc: Oral   SpO2: 97%   Weight: 175 lb (79.4 kg)   Height: 5\' 7"  (1.702 m)       Patient was given the following medications:  Medications - No data to display        Pertinent Labs & Imaging studies reviewed. (See chart for details)   -  Patient seen and evaluated in the emergency department.  -  Triage and nursing notes reviewed and  incorporated.  -  Old chart records reviewed and incorporated.  -  Patient case is not discussed with attending physician,  although Dr. Angelina Pih was available for consultation  -  Differential diagnosis includes:  Pneumonia, URI, influenza, pharyngitis, strep, versus COVID-19  -  Work-up included:  See above CXR, COVID-19 test  -  ED treatment included:   None  - Consults: None  -  Results discussed with patient.  Labs show  CXR is unremarkable.  Covid swab pending at time of discharge.. Pt was given strict return  precautions. The patient is agreeable with plan of care and disposition.  -  Disposition:   home      FINAL IMPRESSION      1. Smell and taste disorder    2. Suspected COVID-19 virus infection          DISPOSITION/PLAN   DISPOSITION        PATIENT REFERREDTO:  [facility]Services  [phone]  Call in 2 days  As needed, If symptoms worsen    [facility] Emergency Department  [address]  [place] [address]  [phone]  Go to   As needed      DISCHARGE MEDICATIONS:  Discharge Medication List as of 01/19/2019  9:25 PM      START taking these medications    Details   aspirin (ASPIRIN CHILDRENS) 81 MG chewable tablet Take 1 tablet by mouth daily, Disp-30 tablet,R-0Print      dexamethasone (DECADRON) 2 MG tablet Take 5 tablets by mouth daily for 5 doses, Disp-25 tablet,R-0Print      albuterol sulfate HFA 108 (90 Base) MCG/ACT inhaler Inhale 2 puffs into the lungs 4 times daily as needed for Wheezing, Disp-1 Inhaler,R-0Print             DISCONTINUED MEDICATIONS:  Discharge Medication List as of 01/19/2019  9:25 PM                 (Please note that portions of this note were completed with a voice recognition program.  Efforts were made to edit the dictations but occasionally words are mis-transcribed.)    Wende Neighbors, APRN - CNP (electronically signed)            Wende Neighbors, APRN - CNP  01/22/19 1835

## 2019-01-20 LAB — COVID-19: SARS-CoV-2, PCR: NOT DETECTED

## 2019-01-20 MED ORDER — ASPIRIN 81 MG PO CHEW
81 MG | ORAL_TABLET | Freq: Every day | ORAL | 0 refills | Status: AC
Start: 2019-01-20 — End: ?

## 2019-01-20 MED ORDER — ALBUTEROL SULFATE HFA 108 (90 BASE) MCG/ACT IN AERS
108 (90 Base) MCG/ACT | Freq: Four times a day (QID) | RESPIRATORY_TRACT | 0 refills | Status: AC | PRN
Start: 2019-01-20 — End: 2019-01-26

## 2019-01-20 MED ORDER — DEXAMETHASONE 2 MG PO TABS
2 MG | ORAL_TABLET | Freq: Every day | ORAL | 0 refills | Status: AC
Start: 2019-01-20 — End: 2019-01-24

## 2019-01-20 NOTE — Care Coordination-Inpatient (Signed)
Care Transition phone outreach s/p acute care visit [phone]  Left HIPAA compliant vm requesting return callback.    Provided & repeated direct contact number to reach this nurse.

## 2019-01-21 NOTE — Care Coordination-Inpatient (Signed)
Patient Excluded from Care Coordination? Yes     The patient will be excluded from Care Coordination for the following reason: Patient unable to contact to enroll
